# Patient Record
Sex: Male | Born: 1975 | Race: White | Hispanic: No | Marital: Married | Smoking: Current every day smoker
Health system: Southern US, Community
[De-identification: ages and names within clinical notes are randomized; demographics above are authoritative.]

## PROBLEM LIST (undated history)

## (undated) DIAGNOSIS — R7303 Prediabetes: Secondary | ICD-10-CM

## (undated) DIAGNOSIS — M549 Dorsalgia, unspecified: Secondary | ICD-10-CM

## (undated) HISTORY — DX: Prediabetes: R73.03

## (undated) HISTORY — DX: Dorsalgia, unspecified: M54.9

---

## 2006-08-04 ENCOUNTER — Emergency Department (HOSPITAL_COMMUNITY): Admission: EM | Admit: 2006-08-04 | Discharge: 2006-08-04 | Payer: Self-pay | Admitting: Emergency Medicine

## 2015-12-25 ENCOUNTER — Other Ambulatory Visit: Payer: Self-pay | Admitting: Orthopedic Surgery

## 2015-12-25 DIAGNOSIS — M25561 Pain in right knee: Secondary | ICD-10-CM

## 2016-11-24 ENCOUNTER — Emergency Department (HOSPITAL_COMMUNITY): Payer: Worker's Compensation

## 2016-11-24 ENCOUNTER — Encounter (HOSPITAL_COMMUNITY): Payer: Self-pay | Admitting: Emergency Medicine

## 2016-11-24 ENCOUNTER — Emergency Department (HOSPITAL_COMMUNITY)
Admission: EM | Admit: 2016-11-24 | Discharge: 2016-11-24 | Disposition: A | Discharge status: Home / Self Care | Payer: Worker's Compensation | Attending: Emergency Medicine | Admitting: Emergency Medicine

## 2016-11-24 DIAGNOSIS — S62525A Nondisplaced fracture of distal phalanx of left thumb, initial encounter for closed fracture: Secondary | ICD-10-CM | POA: Insufficient documentation

## 2016-11-24 DIAGNOSIS — W230XXA Caught, crushed, jammed, or pinched between moving objects, initial encounter: Secondary | ICD-10-CM | POA: Diagnosis not present

## 2016-11-24 DIAGNOSIS — Y929 Unspecified place or not applicable: Secondary | ICD-10-CM | POA: Insufficient documentation

## 2016-11-24 DIAGNOSIS — S62524A Nondisplaced fracture of distal phalanx of right thumb, initial encounter for closed fracture: Secondary | ICD-10-CM | POA: Insufficient documentation

## 2016-11-24 DIAGNOSIS — Y939 Activity, unspecified: Secondary | ICD-10-CM | POA: Diagnosis not present

## 2016-11-24 DIAGNOSIS — S6701XA Crushing injury of right thumb, initial encounter: Secondary | ICD-10-CM | POA: Diagnosis present

## 2016-11-24 DIAGNOSIS — Y998 Other external cause status: Secondary | ICD-10-CM | POA: Diagnosis not present

## 2016-11-24 DIAGNOSIS — F17200 Nicotine dependence, unspecified, uncomplicated: Secondary | ICD-10-CM | POA: Diagnosis not present

## 2016-11-24 MED ORDER — IBUPROFEN 800 MG PO TABS: 800.0000 mg | ORAL_TABLET | Freq: Three times a day (TID) | ORAL | 0 refills | Status: AC

## 2016-11-24 MED ORDER — HYDROCODONE-ACETAMINOPHEN 5-325 MG PO TABS: 2.0000 | ORAL_TABLET | ORAL | 0 refills | Status: DC | PRN

## 2016-11-24 MED ORDER — IBUPROFEN 800 MG PO TABS
800.0000 mg | ORAL_TABLET | Freq: Once | ORAL | Status: AC
  Administered 2016-11-24: 800 mg via ORAL
  Filled 2016-11-24: qty 1

## 2016-11-24 NOTE — ED Notes (Signed)
Patient transported to X-ray 

## 2016-11-24 NOTE — Progress Notes (Signed)
Orthopedic Tech Progress Note Patient Details:  Alex PearsonBradley Leon 05/24/1975 [id number]  Ortho Devices Type of Ortho Device: Finger splint Ortho Device/Splint Location: bi thumb splints Ortho Device/Splint Interventions: Ordered, Application, Adjustment   Trinna PostMartinez, [name] 11/24/2016, 7:25 AM

## 2016-11-24 NOTE — ED Notes (Signed)
Pt given ice pack

## 2016-11-24 NOTE — ED Notes (Signed)
Ortho tech called for splint.

## 2016-11-24 NOTE — Discharge Instructions (Signed)
Return if any problems.  See the Hand Surgeon for evaluation in 1 week Ice area of swelling

## 2016-11-24 NOTE — ED Provider Notes (Signed)
[facility]-EMERGENCY DEPT Provider Note   CSN: [id number] Arrival date & time: 11/24/16  [id number]     History   Chief Complaint Chief Complaint  Patient presents with  . Finger Injury    bilateral thumbs    HPI Alex Leon is a 41 y.o. male.  The history is provided by the patient. No language interpreter was used.  Hand Pain  This is a new problem. The current episode started less than 1 hour ago. The problem occurs constantly. The problem has been gradually worsening. Nothing aggravates the symptoms. Nothing relieves the symptoms. He has tried nothing for the symptoms. The treatment provided no relief.  Pt reports both thumbs were crushed by a press.  Pt complains of swelling and bruising to distal bilatt thumbs.  History reviewed. No pertinent past medical history.  There are no active problems to display for this patient.   History reviewed. No pertinent surgical history.     Home Medications    Prior to Admission medications   Medication Sig Start Date End Date Taking? Authorizing Provider  HYDROcodone-acetaminophen (NORCO/VICODIN) 5-325 MG tablet Take 2 tablets by mouth every 4 (four) hours as needed. 11/24/16   Elson AreasSofia, Chavis Tessler K, PA-C  ibuprofen (ADVIL,MOTRIN) 800 MG tablet Take 1 tablet (800 mg total) by mouth 3 (three) times daily. 11/24/16   Elson AreasSofia, Kahliyah Dick K, PA-C    Family History No family history on file.  Social History Social History  Substance Use Topics  . Smoking status: Current Every Day Smoker    Packs/day: 1.00  . Smokeless tobacco: Never Used  . Alcohol use No     Allergies   Patient has no allergy information on record.   Review of Systems Review of Systems  All other systems reviewed and are negative.    Physical Exam Updated Vital Signs BP (!) 149/86   Pulse 79   Temp 97.7 F (36.5 C) (Oral)   Resp 18   Ht 6' (1.829 m)   Wt 96.2 kg (212 lb)   SpO2 99%   BMI 28.75 kg/m   Physical Exam  Constitutional: He appears well-developed  and well-nourished.  HENT:  Head: Normocephalic.  Musculoskeletal:  Subungual hematoma bilat thumb nails.  Bruising bilat distal thumbs,  Good color distal tips from  Ns and nv intact  Neurological: He is alert.  Skin: Skin is warm.  Psychiatric: He has a normal mood and affect.  Nursing note and vitals reviewed.    ED Treatments / Results  Labs (all labs ordered are listed, but only abnormal results are displayed) Labs Reviewed - No data to display  EKG  EKG Interpretation None       Radiology Dg Finger Thumb Left  Result Date: 11/24/2016 CLINICAL DATA:  Smashed both thumbs in compressor. Initial encounter. EXAM: LEFT THUMB 2+V COMPARISON:  None. FINDINGS: There is a comminuted fracture of the distal tuft of the first distal phalanx, with overlying soft tissue swelling. Visualized joint spaces are preserved. No radiopaque foreign bodies are seen. IMPRESSION: Comminuted fracture of the distal tuft of the first distal phalanx. Electronically Signed   By: Roanna RaiderJeffery  Chang M.D.   On: 11/24/2016 06:21   Dg Finger Thumb Right  Result Date: 11/24/2016 CLINICAL DATA:  Smashed both thumbs in compressor. Initial encounter. EXAM: RIGHT THUMB 2+V COMPARISON:  None. FINDINGS: There is a fracture of the distal tuft of the first distal phalanx, with overlying soft tissue air, concerning for open fracture. Surrounding soft tissue swelling is noted. Visualized joint  spaces are preserved. IMPRESSION: Fracture of the distal tuft of the first distal phalanx, with overlying soft tissue air, concerning for open fracture. Electronically Signed   By: Roanna Raider M.D.   On: 11/24/2016 06:22    Procedures Procedures (including critical care time)  Medications Ordered in ED Medications  ibuprofen (ADVIL,MOTRIN) tablet 800 mg (800 mg Oral Given 11/24/16 0650)     Initial Impression / Assessment and Plan / ED Course  I have reviewed the triage vital signs and the nursing notes.  Pertinent labs &  imaging results that were available during my care of the patient were reviewed by me and considered in my medical decision making (see chart for details).     No evidence of open fracture.  Nails left in place,   Pt counseled on need for follow up,   Final Clinical Impressions(s) / ED Diagnoses   Final diagnoses:  Closed nondisplaced fracture of distal phalanx of right thumb, initial encounter  Closed nondisplaced fracture of distal phalanx of left thumb, initial encounter    New Prescriptions New Prescriptions   HYDROCODONE-ACETAMINOPHEN (NORCO/VICODIN) 5-325 MG TABLET    Take 2 tablets by mouth every 4 (four) hours as needed.   IBUPROFEN (ADVIL,MOTRIN) 800 MG TABLET    Take 1 tablet (800 mg total) by mouth 3 (three) times daily.  An After Visit Summary was printed and given to the patient.   Elson Areas, [place] 11/24/16 1610    Devoria Albe, MD 11/24/16 [phone]

## 2016-11-24 NOTE — ED Triage Notes (Signed)
Pt in from work via [facility] with bilateral thumb injury. Per pt, he was working at [employer] and at [employer], both his thumb nails got caught between the bending part of the press breaks. Caught for 45 seconds. Pt able to move both thumbs, thumb nails blue, cap refill <3 sec, warm. No skin breaks noted

## 2017-01-17 ENCOUNTER — Other Ambulatory Visit: Payer: Self-pay | Admitting: Family Medicine

## 2017-01-17 DIAGNOSIS — R319 Hematuria, unspecified: Secondary | ICD-10-CM

## 2017-01-17 DIAGNOSIS — R634 Abnormal weight loss: Secondary | ICD-10-CM

## 2017-01-18 ENCOUNTER — Ambulatory Visit
Admission: RE | Admit: 2017-01-18 | Discharge: 2017-01-18 | Disposition: A | Discharge status: Home / Self Care | Payer: BLUE CROSS/BLUE SHIELD | Source: Ambulatory Visit | Attending: Family Medicine | Admitting: Family Medicine

## 2017-01-18 DIAGNOSIS — R634 Abnormal weight loss: Secondary | ICD-10-CM

## 2017-01-18 DIAGNOSIS — R319 Hematuria, unspecified: Secondary | ICD-10-CM

## 2018-06-15 DIAGNOSIS — M65821 Other synovitis and tenosynovitis, right upper arm: Secondary | ICD-10-CM | POA: Diagnosis not present

## 2018-08-08 DIAGNOSIS — M4316 Spondylolisthesis, lumbar region: Secondary | ICD-10-CM | POA: Diagnosis not present

## 2018-08-08 DIAGNOSIS — M545 Low back pain: Secondary | ICD-10-CM | POA: Diagnosis not present

## 2018-11-28 IMAGING — CT CT ABD-PELV W/O CM
2 of 4 series · 13 of 36 positions shown, 19 images · non-contrast
Comparison: 08/04/2006

CLINICAL DATA: Evaluate for kidney stone.  Hematuria.

EXAM:
CT ABDOMEN AND PELVIS WITHOUT CONTRAST
TECHNIQUE: Multidetector CT imaging of the abdomen and pelvis was performed
following the standard protocol without IV contrast.

[Series 601: coronal body · coronal · 1.06mm/px · 1 of 120 slices shown, 2 images]
[im 40/120  soft-tissue]
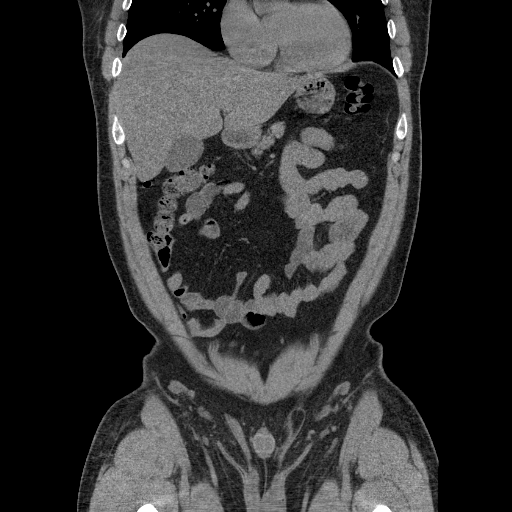
[im 40/120  bone]
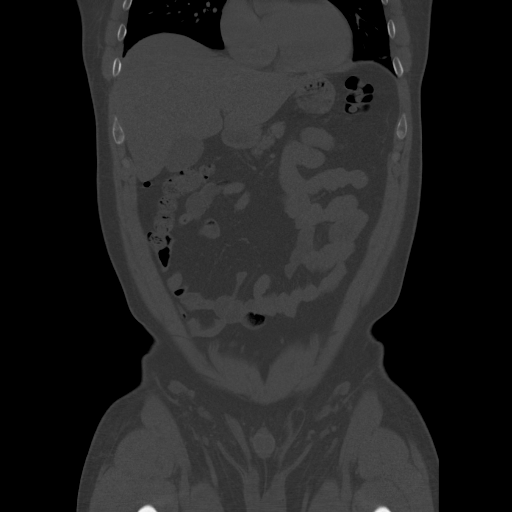

[Series 602: sagittal body · sagittal · 1.06mm/px · 12 of 161 slices shown, 17 images]
[im 10/161  soft-tissue]
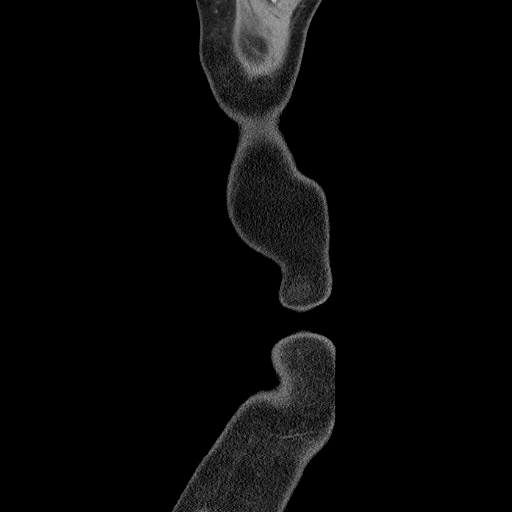
[im 10/161  lung]
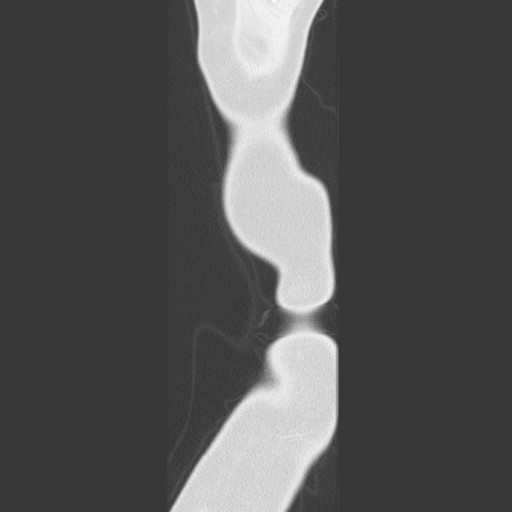
[im 10/161  bone]
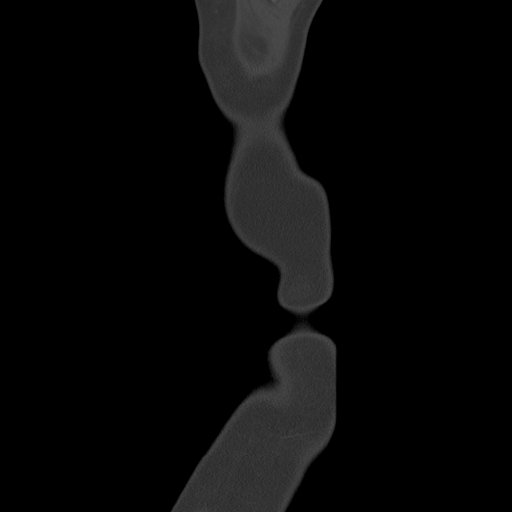
[im 19/161  lung]
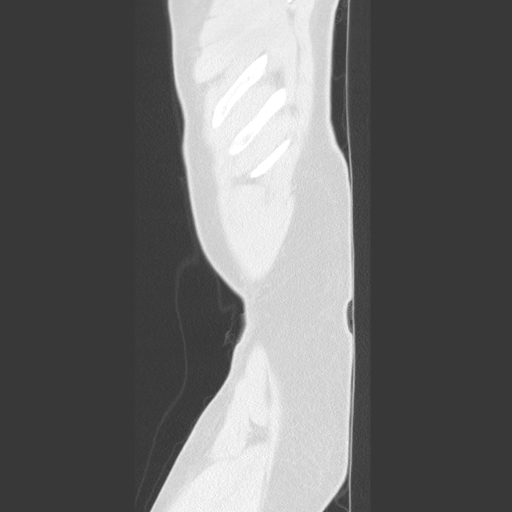
[im 29/161  soft-tissue]
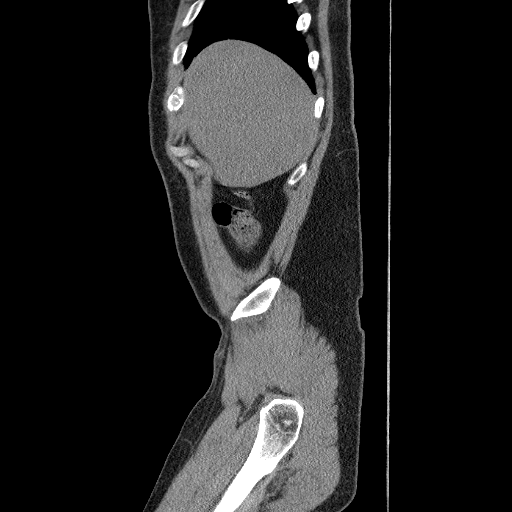
[im 29/161  lung]
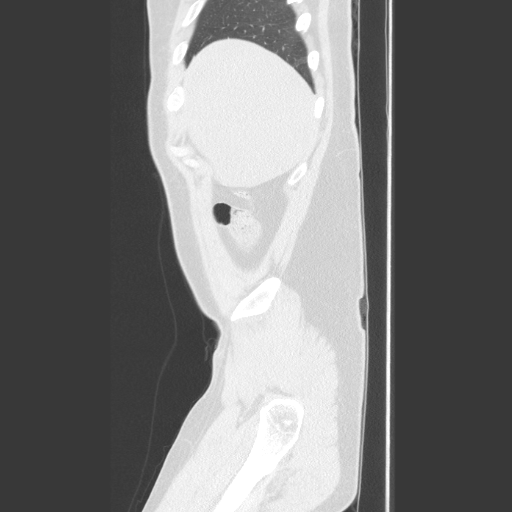
[im 38/161  soft-tissue]
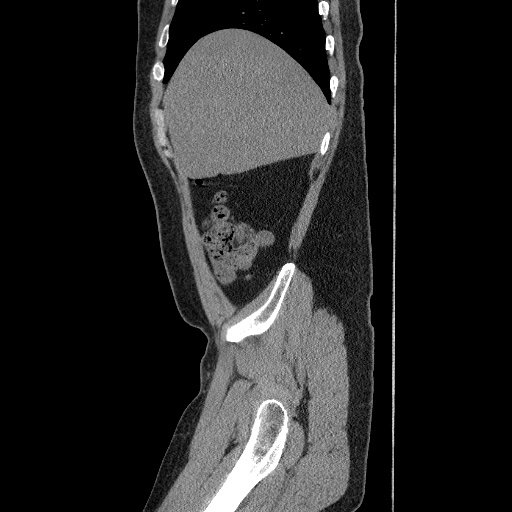
[im 38/161  lung]
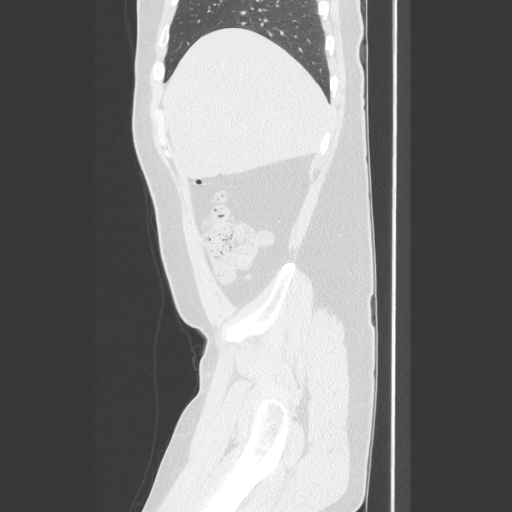
[im 57/161  soft-tissue]
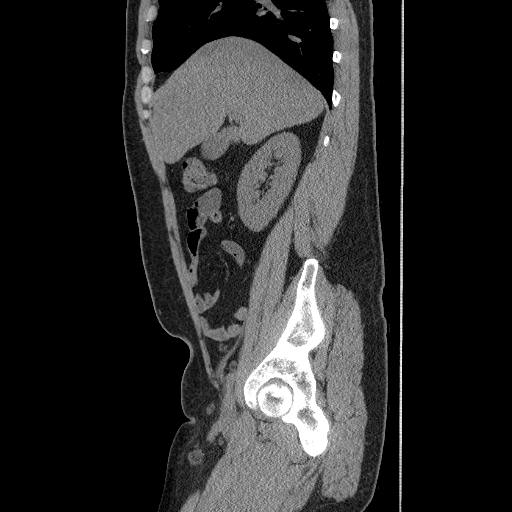
[im 66/161  soft-tissue]
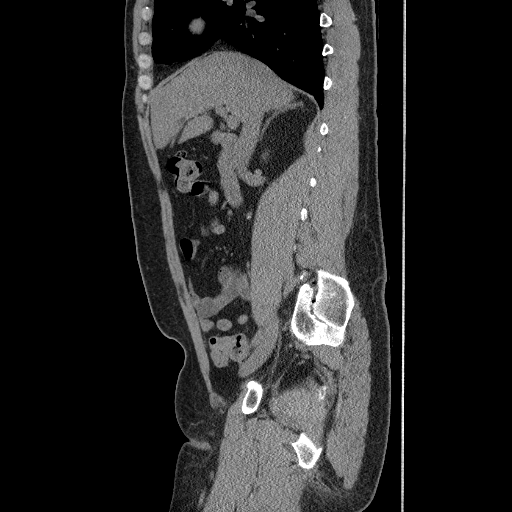
[im 85/161  soft-tissue]
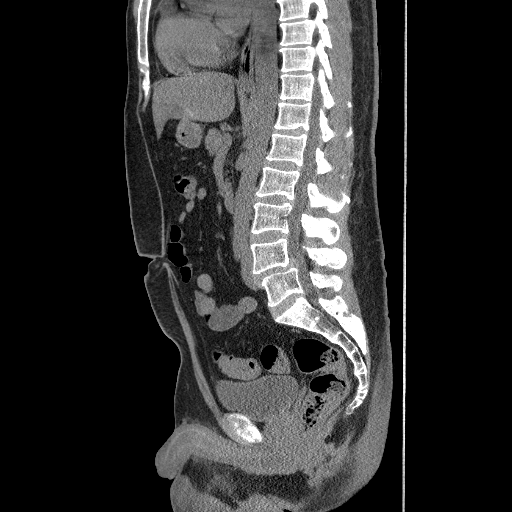
[im 95/161  soft-tissue]
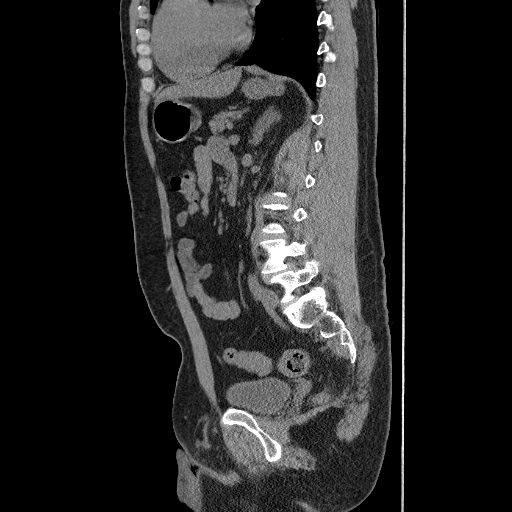
[im 104/161  soft-tissue]
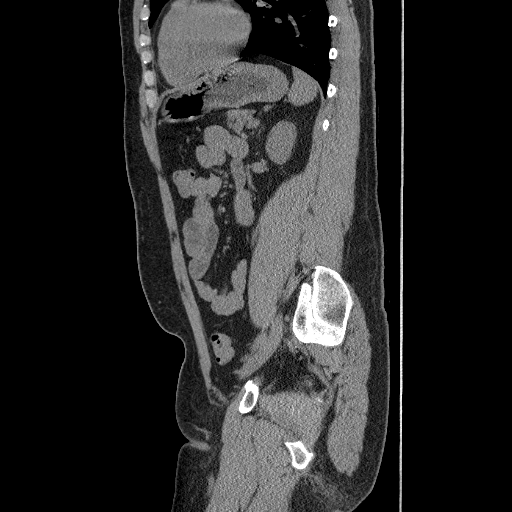
[im 123/161  soft-tissue]
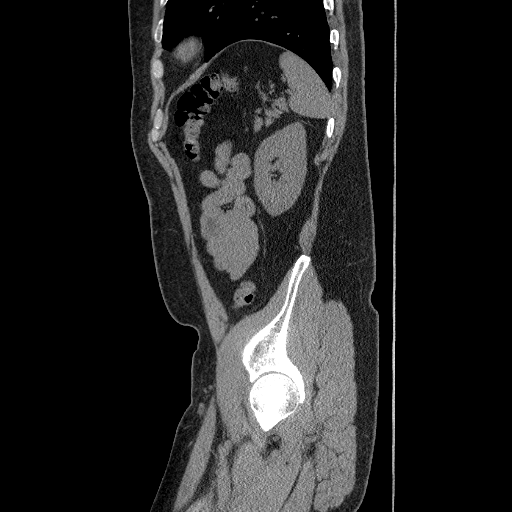
[im 123/161  bone]
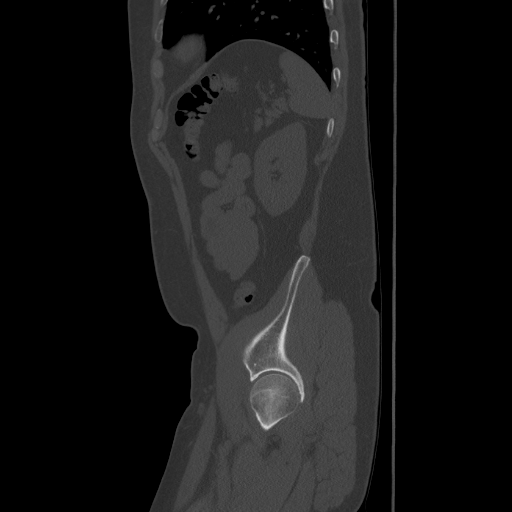
[im 132/161  soft-tissue]
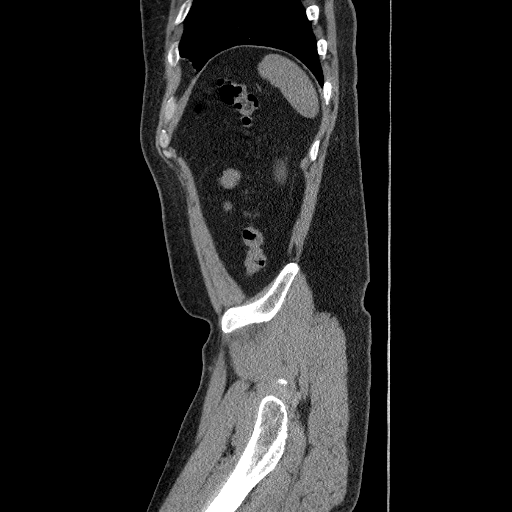
[im 151/161  soft-tissue]
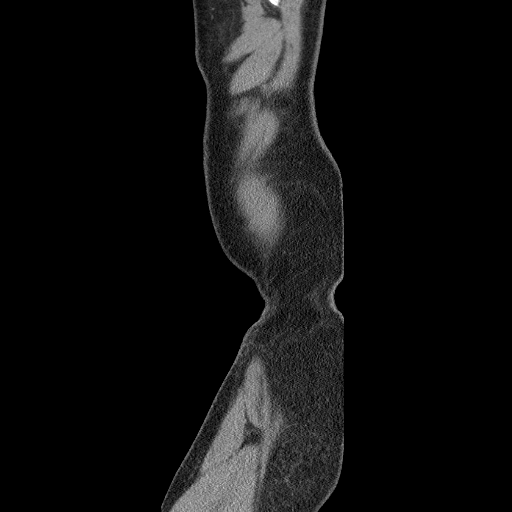

[13 of 36 positions shown; findings below may reference images not displayed]

FINDINGS: Lower chest: Lung bases are clear. No effusions. Heart is normal
size.

Hepatobiliary: Fatty infiltration of the liver. No focal abnormality
or biliary ductal dilatation. Gallbladder unremarkable.

Pancreas: No focal abnormality or ductal dilatation.

Spleen: No focal abnormality.  Normal size.

Adrenals/Urinary Tract: 2 mm proximal right ureteral stone. No
hydronephrosis. No renal or ureteral stones on the left. Urinary
bladder and adrenal glands unremarkable.

Stomach/Bowel: Appendix is normal. Stomach, large and small bowel
grossly unremarkable.

Vascular/Lymphatic: No evidence of aneurysm or adenopathy. Scattered
aortic and iliac calcifications.

Reproductive: No visible focal abnormality.

Other: No free fluid or free air.

Musculoskeletal: No acute bony abnormality. Bilateral L4 pars
defects with grade 1 anterolisthesis.
IMPRESSION: 2 mm proximal right ureteral stone.  No hydronephrosis.

Bilateral L4 pars defects.

## 2019-04-11 DIAGNOSIS — S9031XA Contusion of right foot, initial encounter: Secondary | ICD-10-CM | POA: Diagnosis not present

## 2019-04-11 DIAGNOSIS — W208XXA Other cause of strike by thrown, projected or falling object, initial encounter: Secondary | ICD-10-CM | POA: Diagnosis not present

## 2020-01-29 DIAGNOSIS — M4316 Spondylolisthesis, lumbar region: Secondary | ICD-10-CM | POA: Diagnosis not present

## 2020-01-29 DIAGNOSIS — M545 Low back pain: Secondary | ICD-10-CM | POA: Diagnosis not present

## 2020-03-12 DIAGNOSIS — R3129 Other microscopic hematuria: Secondary | ICD-10-CM | POA: Diagnosis not present

## 2020-03-12 DIAGNOSIS — B356 Tinea cruris: Secondary | ICD-10-CM | POA: Diagnosis not present

## 2020-03-12 DIAGNOSIS — R300 Dysuria: Secondary | ICD-10-CM | POA: Diagnosis not present

## 2020-03-12 DIAGNOSIS — F419 Anxiety disorder, unspecified: Secondary | ICD-10-CM | POA: Diagnosis not present

## 2022-07-19 ENCOUNTER — Encounter: Payer: Self-pay | Admitting: Podiatry

## 2022-07-19 ENCOUNTER — Ambulatory Visit (INDEPENDENT_AMBULATORY_CARE_PROVIDER_SITE_OTHER): Payer: No Typology Code available for payment source | Admitting: Podiatry

## 2022-07-19 DIAGNOSIS — G6289 Other specified polyneuropathies: Secondary | ICD-10-CM

## 2022-07-20 NOTE — Progress Notes (Signed)
  Subjective:  Patient ID: Alex Leon, male    DOB: 1975/09/04,  MRN: [id number]  Chief Complaint  Patient presents with   Numbness    NP - burning, tingling sensation in both feet - started last week - also his toes feel stiff -"feels like ants crawling on and biting his feet"    47 y.o. male presents with the above complaint. History confirmed with patient.  He was referred to me by his father who is also a patient of mine.  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  He sees Dr. Alma Friendly from orthopedic surgery.  Says it has been a year or 2 since he has seen him.  Recently started feeling worsening burning tingling sensations and altered abnormalities  Objective:  Physical Exam: warm, good capillary refill, no trophic changes or ulcerative lesions, normal DP and PT pulses, and he has an abnormal sensory exam with loss of protective and sensation and scattered appreciation of the monofilament, no reproducible pain to palpation or musculoskeletal abnormalities noted.  Assessment:   1. Other polyneuropathy      Plan:  Patient was evaluated and treated and all questions answered.  I reviewed my clinical exam findings with him and I discussed with him that I suspect he likely is developing polyneuropathy and most likely etiology is his spinal history and injury.  He has not had a B12 and folate or A1c checked.  Orders were given for him to check this to evaluate for diabetes and/or B12 deficiency as a contributing factor.  I recommended he return to see Dr. Alma Friendly for further options possible surgery had been discussed previously but the time was not ready for it.  Now that he is worsening may be a better candidate for it.  We also discussed use of gabapentin to alleviate his pain, he thinks he was on this at 1 point but is unsure much help he will discuss this with Dr. Alma Friendly as  well.  I will see him back as needed and will let him know his lab work shows.  Return if symptoms worsen or fail to improve.

## 2024-04-29 ENCOUNTER — Ambulatory Visit: Payer: Self-pay | Admitting: Nurse Practitioner

## 2024-04-29 VITALS — BP 140/78 | HR 83 | Temp 98.1°F | Ht 71.25 in | Wt 194.6 lb

## 2024-04-29 DIAGNOSIS — Z114 Encounter for screening for human immunodeficiency virus [HIV]: Secondary | ICD-10-CM

## 2024-04-29 DIAGNOSIS — Z126 Encounter for screening for malignant neoplasm of bladder: Secondary | ICD-10-CM

## 2024-04-29 DIAGNOSIS — I10 Essential (primary) hypertension: Secondary | ICD-10-CM | POA: Insufficient documentation

## 2024-04-29 DIAGNOSIS — R350 Frequency of micturition: Secondary | ICD-10-CM | POA: Insufficient documentation

## 2024-04-29 DIAGNOSIS — Z13220 Encounter for screening for lipoid disorders: Secondary | ICD-10-CM

## 2024-04-29 DIAGNOSIS — Z0000 Encounter for general adult medical examination without abnormal findings: Secondary | ICD-10-CM | POA: Insufficient documentation

## 2024-04-29 DIAGNOSIS — R7303 Prediabetes: Secondary | ICD-10-CM | POA: Insufficient documentation

## 2024-04-29 DIAGNOSIS — Z1211 Encounter for screening for malignant neoplasm of colon: Secondary | ICD-10-CM

## 2024-04-29 DIAGNOSIS — N529 Male erectile dysfunction, unspecified: Secondary | ICD-10-CM | POA: Insufficient documentation

## 2024-04-29 DIAGNOSIS — R202 Paresthesia of skin: Secondary | ICD-10-CM | POA: Insufficient documentation

## 2024-04-29 DIAGNOSIS — Z1159 Encounter for screening for other viral diseases: Secondary | ICD-10-CM

## 2024-04-29 LAB — CBC WITH DIFFERENTIAL/PLATELET
Basophils Absolute: 0.1 K/uL (ref 0.0–0.1)
Basophils Relative: 0.6 % (ref 0.0–3.0)
Eosinophils Absolute: 0.2 K/uL (ref 0.0–0.7)
Eosinophils Relative: 1.8 % (ref 0.0–5.0)
HCT: 41.8 % (ref 39.0–52.0)
Hemoglobin: 14.3 g/dL (ref 13.0–17.0)
Lymphocytes Relative: 24.5 % (ref 12.0–46.0)
Lymphs Abs: 2.7 K/uL (ref 0.7–4.0)
MCHC: 34.2 g/dL (ref 30.0–36.0)
MCV: 100.6 fl — ABNORMAL HIGH (ref 78.0–100.0)
Monocytes Absolute: 0.6 K/uL (ref 0.1–1.0)
Monocytes Relative: 5.5 % (ref 3.0–12.0)
Neutro Abs: 7.6 K/uL (ref 1.4–7.7)
Neutrophils Relative %: 67.6 % (ref 43.0–77.0)
Platelets: 303 K/uL (ref 150.0–400.0)
RBC: 4.16 Mil/uL — ABNORMAL LOW (ref 4.22–5.81)
RDW: 13.5 % (ref 11.5–15.5)
WBC: 11.2 K/uL — ABNORMAL HIGH (ref 4.0–10.5)

## 2024-04-29 LAB — COMPREHENSIVE METABOLIC PANEL WITH GFR
ALT: 23 U/L (ref 0–53)
AST: 14 U/L (ref 0–37)
Albumin: 4.4 g/dL (ref 3.5–5.2)
Alkaline Phosphatase: 40 U/L (ref 39–117)
BUN: 5 mg/dL — ABNORMAL LOW (ref 6–23)
CO2: 28 meq/L (ref 19–32)
Calcium: 10 mg/dL (ref 8.4–10.5)
Chloride: 105 meq/L (ref 96–112)
Creatinine, Ser: 0.68 mg/dL (ref 0.40–1.50)
GFR: 109.91 mL/min (ref >60.00)
Glucose, Bld: 97 mg/dL (ref 70–99)
Potassium: 3.4 meq/L — ABNORMAL LOW (ref 3.5–5.1)
Sodium: 141 meq/L (ref 135–145)
Total Bilirubin: 0.4 mg/dL (ref 0.2–1.2)
Total Protein: 7.1 g/dL (ref 6.0–8.3)

## 2024-04-29 LAB — LIPID PANEL
Cholesterol: 193 mg/dL (ref 0–200)
HDL: 37.4 mg/dL — ABNORMAL LOW (ref >39.00)
LDL Cholesterol: 126 mg/dL — ABNORMAL HIGH (ref 0–99)
NonHDL: 155.25
Total CHOL/HDL Ratio: 5
Triglycerides: 146 mg/dL (ref 0.0–149.0)
VLDL: 29.2 mg/dL (ref 0.0–40.0)

## 2024-04-29 LAB — VITAMIN B12: Vitamin B-12: 257 pg/mL (ref 211–911)

## 2024-04-29 LAB — TSH: TSH: 1.22 u[IU]/mL (ref 0.35–5.50)

## 2024-04-29 LAB — URINALYSIS, MICROSCOPIC ONLY

## 2024-04-29 LAB — HEMOGLOBIN A1C: Hgb A1c MFr Bld: 6.1 % (ref 4.6–6.5)

## 2024-04-29 MED ORDER — AMLODIPINE BESYLATE 5 MG PO TABS: 5.0000 mg | ORAL_TABLET | Freq: Every day | ORAL | 0 refills | Status: DC

## 2024-04-29 NOTE — Progress Notes (Signed)
 New Patient Office Visit  Subjective    Patient ID: Alex Leon, male    DOB: 1976/01/15  Age: 48 y.o. MRN: [id number]  CC:  Chief Complaint  Patient presents with   Establish Care    Pt would like to discuss High blood pressure and full lab panel.     HPI Alex Leon presents to establish care for complete physical and follow up of chronic conditions.  Chornic back pain: Patient was followed by regional orthopedics.  He has been referred to pain management at this juncture. Patient currently followed by Dr. Ozell Finney.  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 He has 2 readings at [facility] that was 170s-180s. He went to get his med card and he was elevated.  He has tried his spouses amlodipine  with good benefit per his report  Immunizations: -Tetanus: Completed in 2025 -Influenza: reufesed -Shingles: too young  -Pneumonia: discussed in office    Diet: Fair diet. He does 1 meal a day and sometimes he will snack. States that he will drink Dr. Nunzio and no water Exercise: No regular exercise.  Eye exam: Completes annually. Needs updating and wears glasses  Dental exam: Completes semi-annually    Colonoscopy: Cologuard  Lung Cancer Screening: NA   PSA: Too young, currently risk  Sleep: goes to bed around 9 and will get up around 430  to be at work a 6. Feels rested. Does snore     ED: over the past year. He has had toruble with getting and keeping an erection. States that he does not get a full erection but can get penatrated but not to complete ejaculation    Outpatient Encounter Medications as of 04/29/2024  Medication Sig   amLODipine  (NORVASC ) 5 MG tablet Take 1 tablet (5 mg total) by mouth daily.   ibuprofen  (ADVIL ,MOTRIN ) 800 MG tablet Take 1 tablet (800 mg total) by mouth 3 (three) times daily.   methocarbamol (ROBAXIN) 500 MG tablet Take 500 mg by mouth  every 6 (six) hours as needed.   traMADol (ULTRAM) 50 MG tablet Take 100 mg by mouth 4 (four) times daily as needed.   [DISCONTINUED] HYDROcodone -acetaminophen  (NORCO/VICODIN) 5-325 MG tablet Take 2 tablets by mouth every 4 (four) hours as needed.   No facility-administered encounter medications on file as of 04/29/2024.    Past Medical History:  Diagnosis Date   Back pain    Prediabetes     History reviewed. No pertinent surgical history.  Family History  Problem Relation Age of Onset   Diabetes Father    Hypertension Father    Coronary artery disease Father        CABG    Social History   Socioeconomic History   Marital status: Married    Spouse name: Alisa   Number of children: 1   Years of education: Not on file   Highest education level: 12th grade  Occupational History   Not on file  Tobacco Use   Smoking status: Former    Current packs/day: 1.00    Average packs/day: 1 pack/day for 30.0 years (30.0 ttl pk-yrs)    Types: Cigarettes    Start date: 04/29/1994   Smokeless tobacco: Never  Vaping Use   Vaping status: Never Used  Substance and Sexual Activity   Alcohol use: Never   Drug use: Never   Sexual activity: Yes  Other Topics Concern  Not on file  Social History Narrative   Fulltime: Network engineer    Social Drivers of Corporate Investment Banker Strain: Not on file  Food Insecurity: Not on file  Transportation Needs: Not on file  Physical Activity: Not on file  Stress: Not on file  Social Connections: Not on file  Intimate Partner Violence: Not on file    Review of Systems  Constitutional:  Negative for chills and fever.  Respiratory:  Negative for shortness of breath.   Cardiovascular:  Negative for chest pain and leg swelling.  Gastrointestinal:  Negative for abdominal pain, blood in stool, constipation, diarrhea, nausea and vomiting.       BM daily   Genitourinary:  Positive for frequency. Negative for dysuria and hematuria.   Musculoskeletal:  Positive for joint pain and myalgias.  Neurological:  Negative for tingling and headaches.  Psychiatric/Behavioral:  Negative for hallucinations and suicidal ideas.       Objective    BP (!) 140/78   Pulse 83   Temp 98.1 F (36.7 C) (Oral)   Ht 5' 11.25 (1.81 m)   Wt 194 lb 9.6 oz (88.3 kg)   SpO2 97%   BMI 26.95 kg/m   Physical Exam Vitals and nursing note reviewed.  Constitutional:      Appearance: Normal appearance.  HENT:     Right Ear: Tympanic membrane, ear canal and external ear normal.     Left Ear: Tympanic membrane, ear canal and external ear normal.     Mouth/Throat:     Mouth: Mucous membranes are moist.     Pharynx: Oropharynx is clear.  Eyes:     Extraocular Movements: Extraocular movements intact.     Pupils: Pupils are equal, round, and reactive to light.  Cardiovascular:     Rate and Rhythm: Normal rate and regular rhythm.     Pulses: Normal pulses.     Heart sounds: Normal heart sounds.  Pulmonary:     Effort: Pulmonary effort is normal.     Breath sounds: Normal breath sounds.  Abdominal:     General: Bowel sounds are normal. There is no distension.     Palpations: There is no mass.     Tenderness: There is no abdominal tenderness.     Hernia: No hernia is present.  Musculoskeletal:     Right lower leg: No edema.     Left lower leg: No edema.  Lymphadenopathy:     Cervical: No cervical adenopathy.  Skin:    General: Skin is warm.  Neurological:     General: No focal deficit present.     Mental Status: He is alert.     Deep Tendon Reflexes:     Reflex Scores:      Bicep reflexes are 2+ on the right side and 2+ on the left side.      Patellar reflexes are 2+ on the right side and 2+ on the left side.    Comments: Bilateral upper and lower extremity strength 5/5  Psychiatric:        Mood and Affect: Mood normal.        Behavior: Behavior normal.        Thought Content: Thought content normal.        Judgment: Judgment  normal.         Assessment & Plan:   Problem List Items Addressed This Visit       Cardiovascular and Mediastinum   Primary hypertension   Several elevated  readings at urgent care, DOT physical and at home.  Both blood pressure is elevated here today will start patient on amlodipine  5 mg daily.  Encouraged him to check blood pressure at least weekly at home      Relevant Medications   amLODipine  (NORVASC ) 5 MG tablet   Other Relevant Orders   CBC with Differential/Platelet   Comprehensive metabolic panel with GFR   TSH     Other   Preventative health care - Primary   Discussed age-appropriate immunizations and screening exams.  Did review patient's personal, surgical, social, family histories.  Patient is up-to-date on all age-appropriate vaccinations.  Patient declined flu vaccine we did discuss pneumonia vaccine in office.  Cologuard ordered for CRC screening.  Patient is too young for prostate cancer screening.  Patient was given information at discharge about preventative healthcare maintenance with anticipatory guidance      Paresthesia   Patient questions neuropathy.  We will check B12 patient does have lots of lower back pathology      Relevant Orders   Vitamin B12   Prediabetes   Historical diagnosis pending A1c today      Relevant Orders   Hemoglobin A1c   Lipid panel   Urinary frequency   Could be multifactorial.  Pending UA with reflex to culture today      Relevant Orders   Urinalysis w microscopic + reflex cultur   Erectile dysfunction   Pending labs we did discuss the use of PDE 5      Other Visit Diagnoses       Encounter for hepatitis C screening test for low risk patient       Relevant Orders   Hepatitis C antibody     Encounter for screening for HIV       Relevant Orders   HIV antibody (with reflex)     Screening for bladder cancer       Relevant Orders   Urine Microscopic     Screening for lipid disorders       Relevant Orders    Lipid panel     Screening for colon cancer       Relevant Orders   Cologuard       Return in about 6 weeks (around 06/10/2024) for BP recheck.   Adina Crandall, NP

## 2024-04-29 NOTE — Assessment & Plan Note (Signed)
 Could be multifactorial.  Pending UA with reflex to culture today

## 2024-04-29 NOTE — Assessment & Plan Note (Signed)
Historical diagnosis pending A1c today 

## 2024-04-29 NOTE — Assessment & Plan Note (Signed)
 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  Both blood pressure is elevated here today will start patient on amlodipine  5 mg daily.  Encouraged him to check blood pressure at least weekly at home

## 2024-04-29 NOTE — Assessment & Plan Note (Signed)
 Pending labs we did discuss the use of PDE 5

## 2024-04-29 NOTE — Assessment & Plan Note (Signed)
 Discussed age-appropriate immunizations and screening exams.  Did review patient's personal, surgical, social, family histories.  Patient is up-to-date on all age-appropriate vaccinations.  Patient declined flu vaccine we did discuss pneumonia vaccine in office.  Cologuard ordered for CRC screening.  Patient is too young for prostate cancer screening.  Patient was given information at discharge about preventative healthcare maintenance with anticipatory guidance

## 2024-04-29 NOTE — Patient Instructions (Signed)
 Nice to see you today I will be in touch with the labs once I have them Follow up with me in 6 weeks sooner if you need me

## 2024-04-29 NOTE — Assessment & Plan Note (Signed)
 Patient questions neuropathy.  We will check B12 patient does have lots of lower back pathology

## 2024-04-30 ENCOUNTER — Ambulatory Visit: Payer: Self-pay | Admitting: Nurse Practitioner

## 2024-04-30 DIAGNOSIS — N529 Male erectile dysfunction, unspecified: Secondary | ICD-10-CM

## 2024-04-30 DIAGNOSIS — E7800 Pure hypercholesterolemia, unspecified: Secondary | ICD-10-CM | POA: Insufficient documentation

## 2024-04-30 DIAGNOSIS — R195 Other fecal abnormalities: Secondary | ICD-10-CM

## 2024-04-30 MED ORDER — SILDENAFIL CITRATE 50 MG PO TABS: 50.0000 mg | ORAL_TABLET | Freq: Every day | ORAL | 2 refills | Status: AC | PRN

## 2024-05-06 LAB — HIV ANTIBODY (ROUTINE TESTING W REFLEX)
HIV 1&2 Ab, 4th Generation: NONREACTIVE
HIV FINAL INTERPRETATION: NEGATIVE

## 2024-05-06 LAB — URINALYSIS W MICROSCOPIC + REFLEX CULTURE

## 2024-05-06 LAB — HEPATITIS C ANTIBODY: Hepatitis C Ab: NONREACTIVE

## 2024-05-10 LAB — COLOGUARD: COLOGUARD: POSITIVE — AB

## 2024-06-05 ENCOUNTER — Emergency Department (HOSPITAL_BASED_OUTPATIENT_CLINIC_OR_DEPARTMENT_OTHER)
Admission: EM | Admit: 2024-06-05 | Discharge: 2024-06-05 | Disposition: A | Discharge status: Home / Self Care | Source: Ambulatory Visit | Attending: Emergency Medicine | Admitting: Emergency Medicine

## 2024-06-05 ENCOUNTER — Other Ambulatory Visit (HOSPITAL_BASED_OUTPATIENT_CLINIC_OR_DEPARTMENT_OTHER): Payer: Self-pay

## 2024-06-05 ENCOUNTER — Emergency Department (HOSPITAL_BASED_OUTPATIENT_CLINIC_OR_DEPARTMENT_OTHER): Admitting: Radiology

## 2024-06-05 ENCOUNTER — Encounter (HOSPITAL_BASED_OUTPATIENT_CLINIC_OR_DEPARTMENT_OTHER): Payer: Self-pay

## 2024-06-05 ENCOUNTER — Other Ambulatory Visit: Payer: Self-pay

## 2024-06-05 DIAGNOSIS — M79644 Pain in right finger(s): Secondary | ICD-10-CM | POA: Diagnosis present

## 2024-06-05 DIAGNOSIS — I10 Essential (primary) hypertension: Secondary | ICD-10-CM | POA: Insufficient documentation

## 2024-06-05 DIAGNOSIS — Z79899 Other long term (current) drug therapy: Secondary | ICD-10-CM | POA: Insufficient documentation

## 2024-06-05 DIAGNOSIS — L03019 Cellulitis of unspecified finger: Secondary | ICD-10-CM

## 2024-06-05 DIAGNOSIS — L03011 Cellulitis of right finger: Secondary | ICD-10-CM | POA: Diagnosis not present

## 2024-06-05 MED ORDER — LIDOCAINE HCL (PF) 1 % IJ SOLN
5.0000 mL | INTRAMUSCULAR | Status: DC
  Administered 2024-06-05: 5 mL

## 2024-06-05 MED ORDER — CEPHALEXIN 500 MG PO CAPS
500.0000 mg | ORAL_CAPSULE | Freq: Four times a day (QID) | ORAL | 0 refills | Status: AC
  Filled 2024-06-05: qty 28, 7d supply, fill #0

## 2024-06-05 MED ORDER — SULFAMETHOXAZOLE-TRIMETHOPRIM 800-160 MG PO TABS
1.0000 | ORAL_TABLET | Freq: Two times a day (BID) | ORAL | 0 refills | Status: AC
  Filled 2024-06-05: qty 14, 7d supply, fill #0

## 2024-06-05 MED ORDER — OXYCODONE HCL 5 MG PO TABS
5.0000 mg | ORAL_TABLET | ORAL | 0 refills | Status: DC | PRN
  Filled 2024-06-05: qty 2, 1d supply, fill #0

## 2024-06-05 MED ORDER — LIDOCAINE HCL (PF) 1 % IJ SOLN
30.0000 mL | Freq: Once | INTRAMUSCULAR | Status: AC
  Administered 2024-06-05: 30 mL
  Filled 2024-06-05: qty 30

## 2024-06-05 NOTE — ED Provider Notes (Addendum)
 " [place] [facility] AT [facility] Provider Note  CSN: [id number] Arrival date & time: 06/05/24 9247  Chief Complaint(s) Finger Injury  HPI Alex Leon is a 49 y.o. male who is here today with pain in his right fourth finger.  He is right-hand dominant.  Says the symptoms started mid December.  He states the symptoms get some splitting in his fingers.  He states that he had used a clean razor to try to drain some pus out of the area but only got blood.  He went to an urgent care, was prescribed clindamycin which he says helped his symptoms, however when he stopped taking the clindamycin his symptoms returned.  He denies fever or chills.  He has no pain with making a fist.  He states he is prediabetic.   Past Medical History Past Medical History:  Diagnosis Date   Back pain    Prediabetes    Patient Active Problem List   Diagnosis Date Noted   Elevated LDL cholesterol level 04/30/2024   Preventative health care 04/29/2024   Paresthesia 04/29/2024   Prediabetes 04/29/2024   Primary hypertension 04/29/2024   Urinary frequency 04/29/2024   Erectile dysfunction 04/29/2024   Home Medication(s) Prior to Admission medications  Medication Sig Start Date End Date Taking? Authorizing Provider  cephALEXin  (KEFLEX ) 500 MG capsule Take 1 capsule (500 mg total) by mouth 4 (four) times daily. 06/05/24  Yes Mannie Pac T, DO  clindamycin (CLEOCIN) 300 MG capsule Take 300 mg by mouth every 6 (six) hours. 06/01/24  Yes [provider]  oxyCODONE  (ROXICODONE ) 5 MG immediate release tablet Take 1 tablet (5 mg total) by mouth every 4 (four) hours as needed for severe pain (pain score 7-10). 06/05/24  Yes Mannie Pac T, DO  sulfamethoxazole -trimethoprim  (BACTRIM  DS) 800-160 MG tablet Take 1 tablet by mouth 2 (two) times daily for 7 days. 06/05/24 06/12/24 Yes Mannie Pac T, DO  amLODipine  (NORVASC ) 5 MG tablet Take 1 tablet (5 mg total) by mouth daily. 04/29/24    Wendee Lynwood HERO, NP  ibuprofen  (ADVIL ,MOTRIN ) 800 MG tablet Take 1 tablet (800 mg total) by mouth 3 (three) times daily. 11/24/16   Sofia, Leslie K, PA-C  methocarbamol (ROBAXIN) 500 MG tablet Take 500 mg by mouth every 6 (six) hours as needed.    [provider]  sildenafil  (VIAGRA ) 50 MG tablet Take 1 tablet (50 mg total) by mouth daily as needed for erectile dysfunction. 04/30/24   Wendee Lynwood HERO, NP  traMADol (ULTRAM) 50 MG tablet Take 100 mg by mouth 4 (four) times daily as needed.    [provider]                                                                                                                                    Past Surgical History History reviewed. No pertinent surgical history. Family History Family History  Problem Relation Age of  Onset   Diabetes Father    Hypertension Father    Coronary artery disease Father        CABG    Social History Social History[1] Allergies Naproxen  Review of Systems Review of Systems  Physical Exam Vital Signs  I have reviewed the triage vital signs BP (!) 169/88 (BP Location: Right Arm)   Pulse 74   Temp 98.2 F (36.8 C) (Oral)   Resp 14   SpO2 100%   Physical Exam Vitals reviewed.  Eyes:     Pupils: Pupils are equal, round, and reactive to light.  Musculoskeletal:     Comments: Ulceration at the tip of the fourth finger on the right hand with some eschar formation.  There is no crepitus.  Patient has no swelling beyond the tip of the finger, has no pain with flexion, extension of the digit.  Skin:    General: Skin is warm.  Neurological:     Mental Status: He is alert.     ED Results and Treatments Labs (all labs ordered are listed, but only abnormal results are displayed) Labs Reviewed - No data to display                                                                                                                        Radiology DG Finger Ring Right Result Date: 06/05/2024 CLINICAL  DATA:  Right fourth finger discoloration. No injury. Possible osteomyelitis. EXAM: RIGHT RING FINGER 2+V COMPARISON:  None Available. FINDINGS: Minimal irregularity of the distal tuft of the fourth finger without fracture or bone destruction to suggest osteomyelitis. Remainder the exam is unremarkable. IMPRESSION: No acute findings. No evidence of osteomyelitis. Electronically Signed   By: Toribio Agreste M.D.   On: 06/05/2024 09:26    Pertinent labs & imaging results that were available during my care of the patient were reviewed by me and considered in my medical decision making (see MDM for details).  Medications Ordered in ED Medications  lidocaine  (PF) (XYLOCAINE ) 1 % injection 30 mL (30 mLs Other Given 06/05/24 0936)                                                                                                                                     Procedures .Incision and Drainage  Date/Time: 06/05/2024 10:11 AM  Performed by: Mannie Fairy DASEN, DO Authorized by: Mannie Fairy DASEN, DO  Consent:    Consent obtained:  Verbal   Consent given by:  Patient   Risks discussed:  Bleeding, incomplete drainage and pain   Alternatives discussed:  No treatment Universal protocol:    Patient identity confirmed:  Verbally with patient Location:    Indications for incision and drainage: felon.   Location: Finger, 4th L. Anesthesia:    Anesthesia method:  Nerve block   Block anesthetic:  5 mL lidocaine  (PF) 1 % Procedure type:    Complexity:  Simple Comments:     Anesthetize digit using 5 cc of 1% lidocaine  without epinephrine using digital block.  Once areas anesthetized, clean tip of finger with Betadine.  Using an 11 blade, performed a 2 cm linear incision from the fingertip along the ulnar aspect of the fourth finger pad.  There is scant bloody discharge.  Used a Kelly clamp to probe finger pad space.  There is no obvious abscess.  Area soaked in Betadine water solution.  Patient tolerated  procedure well.   (including critical care time)  Medical Decision Making / ED Course   This patient presents to the ED for concern of finger pain, this involves an extensive number of treatment options, and is a complaint that carries with it a high risk of complications and morbidity.  The differential diagnosis includes osteomyelitis, abscess, necrosis, felon, herpetic whitlow.  MDM: Patient's finger does appear to be a felon.  He was improving with antibiotics, he is having worsening swelling.  Will obtain plain films.  Will plan for I&D.  And images shown, patient's finger does have some blue discoloration, however when the finger warms up, coloration improves.  He has good pulses at the base of this finger.  Reassessment 10:10 AM-patient's plain films does not show evidence of osteomyelitis.  Performed I&D for finger felon, mild bloody fluid drained, no pus or purulence.  Area was probed, no abscess.  Could be component of some dry gangrene forming.  Following I&D, finger was soaked, provided with wound care material.  Started on Keflex  and Bactrim , hand surgery follow-up provided.  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  Will discharge patient.  Additional history obtained:  -External records from outside source obtained and reviewed including: Chart review including previous notes, labs, imaging, consultation notes   Lab Tests: -I ordered, reviewed, and interpreted labs.   The pertinent results include:   Labs Reviewed - No data to display     Imaging Studies ordered: I ordered imaging studies including plain films finger I independently visualized and interpreted imaging. I agree with the radiologist interpretation   Medicines ordered and prescription drug management: Meds ordered this encounter  Medications   lidocaine  (PF) (XYLOCAINE ) 1 % injection 30 mL   sulfamethoxazole -trimethoprim  (BACTRIM  DS)  800-160 MG tablet    Sig: Take 1 tablet by mouth 2 (two) times daily for 7 days.    Dispense:  14 tablet    Refill:  0   cephALEXin  (KEFLEX ) 500 MG capsule    Sig: Take 1 capsule (500 mg total) by mouth 4 (four) times daily.    Dispense:  28 capsule    Refill:  0   oxyCODONE  (ROXICODONE ) 5 MG immediate release tablet    Sig: Take 1 tablet (5 mg total) by mouth every 4 (four) hours as needed for severe pain (pain score 7-10).    Dispense:  2 tablet    Refill:  0    -I have reviewed  the patients home medicines and have made adjustments as needed    Social Determinants of Health:  Factors impacting patients care include: Lack of access to primary care   Reevaluation: After the interventions noted above, I reevaluated the patient and found that they have :improved  Co morbidities that complicate the patient evaluation  Past Medical History:  Diagnosis Date   Back pain    Prediabetes       Dispostion: I considered admission for this patient, however he is appropriate for outpatient management.     Final Clinical Impression(s) / ED Diagnoses Final diagnoses:  Felon of finger     @PCDICTATION @    Mannie Pac T, DO 06/05/24 1020     [1]  Social History Tobacco Use   Smoking status: Former    Current packs/day: 1.00    Average packs/day: 1 pack/day for 30.1 years (30.1 ttl pk-yrs)    Types: Cigarettes    Start date: 04/29/1994   Smokeless tobacco: Never  Vaping Use   Vaping status: Never Used  Substance Use Topics   Alcohol use: Never   Drug use: Never     Mannie Pac T, DO 06/06/24 0748  "

## 2024-06-05 NOTE — ED Triage Notes (Signed)
 Right 4th digit discoloration, no known injury. Says he thinks it may be infected from it splitting due to drying out. Antibiotics given at urgent care.

## 2024-06-05 NOTE — Discharge Instructions (Addendum)
 While you were in the emergency room, you had an x-ray done of your finger that did not show osteomyelitis or infection of the bone.  Based on your exam, believe this is likely a finger felon, or infection of the deep space of the finger.  This area was opened and drained.  I recommend soaking it in warm water a few times per day.  Gently wash area with soap and water.  You may apply an antibiotic ointment to the area.  Please call the hand doctor listed in your discharge paperwork today for follow-up appointment.  I have sent you prescriptions for 2 antibiotics.  The first is Bactrim , take it once in the morning once in the evening for the next 1 week.  The second is Keflex , take it 4 times per day for the next 1 week.  You can take Motrin  and Tylenol  at home for pain.  I have also sent you 2 oxycodone  pills for severe pain after the drainage today.  Return to the emergency room if you develop worsening pain in your finger, fever, chills, inability to move your finger or hand.

## 2024-06-10 ENCOUNTER — Ambulatory Visit: Admitting: Nurse Practitioner

## 2024-06-10 VITALS — BP 140/68 | HR 65 | Temp 98.1°F | Ht 71.25 in | Wt 198.0 lb

## 2024-06-10 DIAGNOSIS — E663 Overweight: Secondary | ICD-10-CM | POA: Diagnosis not present

## 2024-06-10 DIAGNOSIS — I10 Essential (primary) hypertension: Secondary | ICD-10-CM | POA: Diagnosis not present

## 2024-06-10 MED ORDER — AMLODIPINE BESYLATE 10 MG PO TABS: 10.0000 mg | ORAL_TABLET | Freq: Every day | ORAL | 0 refills | Status: AC

## 2024-06-10 NOTE — Progress Notes (Signed)
 "  Established Patient Office Visit  Subjective   Patient ID: Alex Leon, male    DOB: 03/17/1976  Age: 49 y.o. MRN: [id number]  Chief Complaint  Patient presents with   Follow-up    BP recheck. Pt States he reports bp at home but has not dropped drastically. 196/100.     HPI  Discussed the use of AI scribe software for clinical note transcription with the patient, who gave verbal consent to proceed.  History of Present Illness Alex Leon is a 49 year old male with hypertension who presents for follow-up of elevated blood pressure.  He has been taking amlodipine  5 mg daily without experiencing side effects such as leg swelling, lightheadedness, dizziness, headaches, or changes in vision. He has not been checking his blood pressure as frequently as recommended but notes variable readings, including a recent 169/88 mmHg at a doctor's visit. He is unsure about a previous reading of 196/100 mmHg and acknowledges that his blood pressure remains above the target goal.  He occasionally consumes energy drinks, specifically Monster, which he believes affects his blood pressure. He has reduced his intake from daily to once every couple of weeks. He continues to drink Dr. Nunzio regularly and does not drink water, which he acknowledges needs improvement. He describes feeling sluggish at times, attributing it to his blood pressure.  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 He has not engaged in additional exercise outside of work. His weight has fluctuated between 194 and 198 pounds recently, down from 210-215 pounds in previous years. He notes that his weight tends to increase during the holiday season.  Family history is significant for cardiovascular disease; his paternal grandfather died of a massive heart attack at age 66.    Review of Systems  Constitutional:  Negative for chills and fever.  Eyes:  Negative for  blurred vision and double vision.  Respiratory:  Negative for shortness of breath.   Cardiovascular:  Negative for chest pain and leg swelling.  Gastrointestinal:  Negative for constipation.  Neurological:  Negative for dizziness and headaches.      Objective:     BP (!) 140/68   Pulse 65   Temp 98.1 F (36.7 C) (Oral)   Ht 5' 11.25 (1.81 m)   Wt 198 lb (89.8 kg)   SpO2 97%   BMI 27.42 kg/m  BP Readings from Last 3 Encounters:  06/10/24 (!) 140/68  06/05/24 (!) 169/88  04/29/24 (!) 140/78   Wt Readings from Last 3 Encounters:  06/10/24 198 lb (89.8 kg)  04/29/24 194 lb 9.6 oz (88.3 kg)  11/24/16 212 lb (96.2 kg)   SpO2 Readings from Last 3 Encounters:  06/10/24 97%  06/05/24 100%  04/29/24 97%      Physical Exam Vitals and nursing note reviewed.  Constitutional:      Appearance: Normal appearance.  Cardiovascular:     Rate and Rhythm: Normal rate and regular rhythm.     Heart sounds: Normal heart sounds.  Pulmonary:     Effort: Pulmonary effort is normal.     Breath sounds: Normal breath sounds.  Neurological:     Mental Status: He is alert.      No results found for any visits on 06/10/24.    The 10-year ASCVD risk score (Arnett DK, et al., 2019) is: 5.1%    Assessment & Plan:   Problem List Items Addressed This Visit  Cardiovascular and Mediastinum   Primary hypertension - Primary   Relevant Medications   amLODipine  (NORVASC ) 10 MG tablet   Other Visit Diagnoses       Overweight          Assessment and Plan Assessment & Plan Primary hypertension Blood pressure remains uncontrolled with current amlodipine  5 mg daily. Lifestyle factors and family history contribute to risk. Increasing amlodipine  may improve control but could cause leg swelling. - Increased amlodipine  to 10 mg daily. Use two 5 mg tablets if available until new prescription is filled. - Monitor blood pressure at home regularly. - Advised reduction of energy drink  consumption and increased water intake. - Encouraged regular physical activity beyond occupational activity. - Discussed potential side effect of leg swelling with increased amlodipine  dosage. - Scheduled follow-up in six weeks to reassess blood pressure and medication efficacy.  Return in about 6 weeks (around 07/22/2024) for BP recheck.    Adina Crandall, NP  "

## 2024-06-10 NOTE — Patient Instructions (Addendum)
 Nice to see you today I am going to increase the amlodipine  to 10mg  daily You can take 2 of the 5mg  tablets at the same time daily until you finish the supply you have. Then it will go back to 1 (10mg ) daily Follow up with me in 6 weeks, sooner if you need me

## 2024-07-22 ENCOUNTER — Ambulatory Visit: Admitting: Nurse Practitioner
